# Patient Record
Sex: Female | Born: 2000 | Race: Asian | Hispanic: No | Marital: Single | Smoking: Never smoker
Health system: Southern US, Community
[De-identification: ages and names within clinical notes are randomized; demographics above are authoritative.]

---

## 2021-04-28 ENCOUNTER — Other Ambulatory Visit: Payer: Self-pay

## 2021-04-28 ENCOUNTER — Emergency Department (HOSPITAL_COMMUNITY): Payer: Medicaid Other

## 2021-04-28 ENCOUNTER — Emergency Department (HOSPITAL_COMMUNITY)
Admission: EM | Admit: 2021-04-28 | Discharge: 2021-04-29 | Disposition: A | Discharge status: Home / Self Care | Payer: Medicaid Other | Attending: Emergency Medicine | Admitting: Emergency Medicine

## 2021-04-28 DIAGNOSIS — R1011 Right upper quadrant pain: Secondary | ICD-10-CM

## 2021-04-28 DIAGNOSIS — R1013 Epigastric pain: Secondary | ICD-10-CM | POA: Diagnosis not present

## 2021-04-28 DIAGNOSIS — R109 Unspecified abdominal pain: Secondary | ICD-10-CM | POA: Diagnosis present

## 2021-04-28 DIAGNOSIS — K2900 Acute gastritis without bleeding: Secondary | ICD-10-CM | POA: Insufficient documentation

## 2021-04-28 LAB — CBC WITH DIFFERENTIAL/PLATELET
Abs Immature Granulocytes: 0.03 10*3/uL (ref 0.00–0.07)
Basophils Absolute: 0 10*3/uL (ref 0.0–0.1)
Basophils Relative: 0 %
Eosinophils Absolute: 0.1 10*3/uL (ref 0.0–0.5)
Eosinophils Relative: 1 %
HCT: 39.2 % (ref 36.0–46.0)
Hemoglobin: 12.9 g/dL (ref 12.0–15.0)
Immature Granulocytes: 0 %
Lymphocytes Relative: 19 %
Lymphs Abs: 1.7 10*3/uL (ref 0.7–4.0)
MCH: 29.1 pg (ref 26.0–34.0)
MCHC: 32.9 g/dL (ref 30.0–36.0)
MCV: 88.3 fL (ref 80.0–100.0)
Monocytes Absolute: 0.4 10*3/uL (ref 0.1–1.0)
Monocytes Relative: 5 %
Neutro Abs: 6.7 10*3/uL (ref 1.7–7.7)
Neutrophils Relative %: 75 %
Platelets: 266 10*3/uL (ref 150–400)
RBC: 4.44 MIL/uL (ref 3.87–5.11)
RDW: 13 % (ref 11.5–15.5)
WBC: 8.9 10*3/uL (ref 4.0–10.5)
nRBC: 0 % (ref 0.0–0.2)

## 2021-04-28 LAB — URINALYSIS, ROUTINE W REFLEX MICROSCOPIC
Bacteria, UA: NONE SEEN
Bilirubin Urine: NEGATIVE
Glucose, UA: NEGATIVE mg/dL
Ketones, ur: 20 mg/dL — AB
Nitrite: NEGATIVE
Protein, ur: NEGATIVE mg/dL
Specific Gravity, Urine: 1.026 (ref 1.005–1.030)
pH: 5 (ref 5.0–8.0)

## 2021-04-28 LAB — COMPREHENSIVE METABOLIC PANEL
ALT: 14 U/L (ref 0–44)
AST: 16 U/L (ref 15–41)
Albumin: 4.3 g/dL (ref 3.5–5.0)
Alkaline Phosphatase: 63 U/L (ref 38–126)
Anion gap: 9 (ref 5–15)
BUN: 9 mg/dL (ref 6–20)
CO2: 21 mmol/L — ABNORMAL LOW (ref 22–32)
Calcium: 9.3 mg/dL (ref 8.9–10.3)
Chloride: 107 mmol/L (ref 98–111)
Creatinine, Ser: 0.69 mg/dL (ref 0.44–1.00)
GFR, Estimated: >60 mL/min (ref >60)
Glucose, Bld: 92 mg/dL (ref 70–99)
Potassium: 3.7 mmol/L (ref 3.5–5.1)
Sodium: 137 mmol/L (ref 135–145)
Total Bilirubin: 0.9 mg/dL (ref 0.3–1.2)
Total Protein: 7.4 g/dL (ref 6.5–8.1)

## 2021-04-28 LAB — LIPASE, BLOOD: Lipase: 29 U/L (ref 11–51)

## 2021-04-28 LAB — POC URINE PREG, ED: Preg Test, Ur: NEGATIVE

## 2021-04-28 NOTE — ED Provider Notes (Signed)
Emergency Medicine Provider Triage Evaluation Note  Emily Stanley , a 21 y.o. female  was evaluated in triage.  Pt complains of epigastric pain that started earlier today. Describes it as a burning sensation, worse after meals. She endorses a few episodes of NBNB emesis. No previous abdominal operations. No fever or chills. Denies chest pain and shortness of breath. Denies alcohol use. No chronic NSAIDs.   Review of Systems  Positive: Abdominal pain, nausea, vomiting Negative: fever  Physical Exam  BP 112/84 (BP Location: Left Arm)   Pulse 86   Temp 98.4 F (36.9 C) (Oral)   Resp 18   Ht 4\' 11"  (1.499 m)   Wt 61.7 kg   SpO2 99%   BMI 27.47 kg/m  Gen:   Awake, no distress   Resp:  Normal effort  MSK:   Moves extremities without difficulty  Other:  TTP is epigastric region with guarding  Medical Decision Making  Medically screening exam initiated at 8:17 PM.  Appropriate orders placed.  Emily Stanley was informed that the remainder of the evaluation will be completed by another provider, this initial triage assessment does not replace that evaluation, and the importance of remaining in the ED until their evaluation is complete.  Abdominal labs. RUQ Venia Carbon ordered   US 04/28/21 2019    2020, MD 04/28/21 [phone]

## 2021-04-28 NOTE — ED Triage Notes (Signed)
Pt endorses epigastric burning after eating and when lying down to sleep.  Has had vomiting.

## 2021-04-28 NOTE — ED Provider Notes (Signed)
[facility] EMERGENCY DEPARTMENT Provider Note   CSN: [id number] Arrival date & time: 04/28/21  1916     History Chief Complaint  Patient presents with   Abdominal Pain    Emily Stanley is a 20 y.o. female.  Patient presents to the emergency department for evaluation of abdominal pain.  Patient reports that she has been having upper central abdominal pain and burning with nausea and vomiting.  No hematemesis or melena.  Pain does worsen with meals.      No past medical history on file.  There are no problems to display for this patient.      OB History   No obstetric history on file.     No family history on file.     Home Medications Prior to Admission medications   Medication Sig Start Date End Date Taking? Authorizing Provider  famotidine (PEPCID) 20 MG tablet Take 1 tablet (20 mg total) by mouth 2 (two) times daily. 04/29/21  Yes Buffie Herne, Canary Brim, MD  sucralfate (CARAFATE) 1 g tablet Take 1 tablet (1 g total) by mouth 4 (four) times daily -  with meals and at bedtime. 04/29/21  Yes Shi Blankenship, Canary Brim, MD    Allergies    Patient has no known allergies.  Review of Systems   Review of Systems  Gastrointestinal:  Positive for abdominal pain, nausea and vomiting.  All other systems reviewed and are negative.  Physical Exam Updated Vital Signs BP 111/65   Pulse 90   Temp 98.4 F (36.9 C) (Oral)   Resp (!) 29   Ht 4\' 11"  (1.499 m)   Wt 61.7 kg   SpO2 98%   BMI 27.47 kg/m   Physical Exam Vitals and nursing note reviewed.  Constitutional:      General: She is not in acute distress.    Appearance: Normal appearance. She is well-developed.  HENT:     Head: Normocephalic and atraumatic.     Right Ear: Hearing normal.     Left Ear: Hearing normal.     Nose: Nose normal.  Eyes:     Conjunctiva/sclera: Conjunctivae normal.     Pupils: Pupils are equal, round, and reactive to light.  Cardiovascular:     Rate and Rhythm:  Regular rhythm.     Heart sounds: S1 normal and S2 normal. No murmur heard.   No friction rub. No gallop.  Pulmonary:     Effort: Pulmonary effort is normal. No respiratory distress.     Breath sounds: Normal breath sounds.  Chest:     Chest wall: No tenderness.  Abdominal:     General: Bowel sounds are normal.     Palpations: Abdomen is soft.     Tenderness: There is abdominal tenderness in the epigastric area. There is no guarding or rebound. Negative signs include Murphy's sign and McBurney's sign.     Hernia: No hernia is present.  Musculoskeletal:        General: Normal range of motion.     Cervical back: Normal range of motion and neck supple.  Skin:    General: Skin is warm and dry.     Findings: No rash.  Neurological:     Mental Status: She is alert and oriented to person, place, and time.     GCS: GCS eye subscore is 4. GCS verbal subscore is 5. GCS motor subscore is 6.     Cranial Nerves: No cranial nerve deficit.     Sensory: No sensory  deficit.     Coordination: Coordination normal.  Psychiatric:        Speech: Speech normal.        Behavior: Behavior normal.        Thought Content: Thought content normal.    ED Results / Procedures / Treatments   Labs (all labs ordered are listed, but only abnormal results are displayed) Labs Reviewed  COMPREHENSIVE METABOLIC PANEL - Abnormal; Notable for the following components:      Result Value   CO2 21 (*)    All other components within normal limits  URINALYSIS, ROUTINE W REFLEX MICROSCOPIC - Abnormal; Notable for the following components:   APPearance HAZY (*)    Hgb urine dipstick SMALL (*)    Ketones, ur 20 (*)    Leukocytes,Ua TRACE (*)    All other components within normal limits  CBC WITH DIFFERENTIAL/PLATELET  LIPASE, BLOOD  POC URINE PREG, ED    EKG EKG Interpretation  Date/Time:  Thursday April 28 2021 20:30:54 EDT Ventricular Rate:  81 PR Interval:  148 QRS Duration: 74 QT Interval:  364 QTC  Calculation: 422 R Axis:   78 Text Interpretation: Normal sinus rhythm Normal ECG Confirmed by Gilda Crease [phone]) on 04/28/2021 11:35:05 PM  Radiology US Abdomen Limited RUQ (LIVER/GB)  Result Date: 04/28/2021 CLINICAL DATA:  20 year old female with right upper quadrant abdominal pain. EXAM: ULTRASOUND ABDOMEN LIMITED RIGHT UPPER QUADRANT COMPARISON:  None. FINDINGS: Gallbladder: No gallstones or wall thickening visualized. No sonographic Murphy sign noted by sonographer. Common bile duct: Diameter: 3 mm Liver: No focal lesion identified. Within normal limits in parenchymal echogenicity. Portal vein is patent on color Doppler imaging with normal direction of blood flow towards the liver. Other: None. IMPRESSION: Unremarkable right upper quadrant ultrasound. Electronically Signed   By: Elgie Collard M.D.   On: 04/28/2021 21:08    Procedures Procedures   Medications Ordered in ED Medications  sodium chloride 0.9 % bolus 1,000 mL (0 mLs Intravenous Stopped 04/29/21 0259)  famotidine (PEPCID) IVPB 20 mg premix (0 mg Intravenous Stopped 04/29/21 0203)  ondansetron (ZOFRAN) injection 4 mg (4 mg Intravenous Given 04/29/21 0119)  alum & mag hydroxide-simeth (MAALOX/MYLANTA) 200-200-20 MG/5ML suspension 30 mL (30 mLs Oral Given 04/29/21 0121)    And  lidocaine (XYLOCAINE) 2 % viscous mouth solution 15 mL (15 mLs Oral Given 04/29/21 0121)    ED Course  I have reviewed the triage vital signs and the nursing notes.  Pertinent labs & imaging results that were available during my care of the patient were reviewed by me and considered in my medical decision making (see chart for details).    MDM Rules/Calculators/A&P                          Patient presents to the emergency department for evaluation of abdominal pain.  Patient experiencing upper abdominal pain with nausea and vomiting.  Pain is in the epigastric region.  She does not have any specific right upper quadrant tenderness on  exam.  Lab work reassuring.  Gallbladder ultrasound negative.  Clinical presentation consistent with gastritis.  Final Clinical Impression(s) / ED Diagnoses Final diagnoses:  RUQ pain  Acute gastritis without hemorrhage, unspecified gastritis type    Rx / DC Orders ED Discharge Orders          Ordered    famotidine (PEPCID) 20 MG tablet  2 times daily        04/29/21 5681  sucralfate (CARAFATE) 1 g tablet  3 times daily with meals & bedtime        04/29/21 0338             Gilda Crease, MD 04/29/21 [phone]

## 2021-04-29 MED ORDER — FAMOTIDINE IN NACL 20-0.9 MG/50ML-% IV SOLN
20.0000 mg | Freq: Once | INTRAVENOUS | Status: AC
  Administered 2021-04-29: 20 mg via INTRAVENOUS
  Filled 2021-04-29: qty 50

## 2021-04-29 MED ORDER — FAMOTIDINE 20 MG PO TABS
20.0000 mg | ORAL_TABLET | Freq: Two times a day (BID) | ORAL | 0 refills | Status: DC
Start: 2021-04-29 — End: 2021-07-19

## 2021-04-29 MED ORDER — LIDOCAINE VISCOUS HCL 2 % MT SOLN
15.0000 mL | Freq: Once | OROMUCOSAL | Status: AC
  Administered 2021-04-29: 15 mL via ORAL
  Filled 2021-04-29: qty 15

## 2021-04-29 MED ORDER — SUCRALFATE 1 G PO TABS: 1.0000 g | ORAL_TABLET | Freq: Three times a day (TID) | ORAL | 0 refills | Status: DC

## 2021-04-29 MED ORDER — ALUM & MAG HYDROXIDE-SIMETH 200-200-20 MG/5ML PO SUSP
30.0000 mL | Freq: Once | ORAL | Status: AC
  Administered 2021-04-29: 30 mL via ORAL
  Filled 2021-04-29: qty 30

## 2021-04-29 MED ORDER — ONDANSETRON HCL 4 MG/2ML IJ SOLN
4.0000 mg | Freq: Once | INTRAMUSCULAR | Status: AC
  Administered 2021-04-29: 4 mg via INTRAVENOUS
  Filled 2021-04-29: qty 2

## 2021-04-29 MED ORDER — SODIUM CHLORIDE 0.9 % IV BOLUS
1000.0000 mL | Freq: Once | INTRAVENOUS | Status: AC
  Administered 2021-04-29: 1000 mL via INTRAVENOUS

## 2021-06-16 DIAGNOSIS — Z419 Encounter for procedure for purposes other than remedying health state, unspecified: Secondary | ICD-10-CM | POA: Diagnosis not present

## 2021-07-16 DIAGNOSIS — Z419 Encounter for procedure for purposes other than remedying health state, unspecified: Secondary | ICD-10-CM | POA: Diagnosis not present

## 2021-07-19 ENCOUNTER — Encounter (HOSPITAL_BASED_OUTPATIENT_CLINIC_OR_DEPARTMENT_OTHER): Payer: Self-pay

## 2021-07-19 ENCOUNTER — Other Ambulatory Visit: Payer: Self-pay

## 2021-07-19 ENCOUNTER — Emergency Department (HOSPITAL_BASED_OUTPATIENT_CLINIC_OR_DEPARTMENT_OTHER)
Admission: EM | Admit: 2021-07-19 | Discharge: 2021-07-19 | Disposition: A | Discharge status: Home / Self Care | Payer: Medicaid Other | Attending: Emergency Medicine | Admitting: Emergency Medicine

## 2021-07-19 DIAGNOSIS — R1013 Epigastric pain: Secondary | ICD-10-CM | POA: Diagnosis not present

## 2021-07-19 DIAGNOSIS — R112 Nausea with vomiting, unspecified: Secondary | ICD-10-CM | POA: Diagnosis not present

## 2021-07-19 DIAGNOSIS — R1111 Vomiting without nausea: Secondary | ICD-10-CM | POA: Diagnosis not present

## 2021-07-19 DIAGNOSIS — R109 Unspecified abdominal pain: Secondary | ICD-10-CM | POA: Diagnosis not present

## 2021-07-19 DIAGNOSIS — Z743 Need for continuous supervision: Secondary | ICD-10-CM | POA: Diagnosis not present

## 2021-07-19 DIAGNOSIS — R110 Nausea: Secondary | ICD-10-CM | POA: Diagnosis not present

## 2021-07-19 LAB — COMPREHENSIVE METABOLIC PANEL
ALT: 11 U/L (ref 0–44)
AST: 13 U/L — ABNORMAL LOW (ref 15–41)
Albumin: 4.6 g/dL (ref 3.5–5.0)
Alkaline Phosphatase: 68 U/L (ref 38–126)
Anion gap: 13 (ref 5–15)
BUN: 12 mg/dL (ref 6–20)
CO2: 21 mmol/L — ABNORMAL LOW (ref 22–32)
Calcium: 9.6 mg/dL (ref 8.9–10.3)
Chloride: 107 mmol/L (ref 98–111)
Creatinine, Ser: 0.57 mg/dL (ref 0.44–1.00)
GFR, Estimated: >60 mL/min (ref >60)
Glucose, Bld: 100 mg/dL — ABNORMAL HIGH (ref 70–99)
Potassium: 3.8 mmol/L (ref 3.5–5.1)
Sodium: 141 mmol/L (ref 135–145)
Total Bilirubin: 0.5 mg/dL (ref 0.3–1.2)
Total Protein: 7.4 g/dL (ref 6.5–8.1)

## 2021-07-19 LAB — CBC WITH DIFFERENTIAL/PLATELET
Abs Immature Granulocytes: 0.01 10*3/uL (ref 0.00–0.07)
Basophils Absolute: 0 10*3/uL (ref 0.0–0.1)
Basophils Relative: 0 %
Eosinophils Absolute: 0.1 10*3/uL (ref 0.0–0.5)
Eosinophils Relative: 1 %
HCT: 39.2 % (ref 36.0–46.0)
Hemoglobin: 13.4 g/dL (ref 12.0–15.0)
Immature Granulocytes: 0 %
Lymphocytes Relative: 28 %
Lymphs Abs: 1.7 10*3/uL (ref 0.7–4.0)
MCH: 29.4 pg (ref 26.0–34.0)
MCHC: 34.2 g/dL (ref 30.0–36.0)
MCV: 86 fL (ref 80.0–100.0)
Monocytes Absolute: 0.3 10*3/uL (ref 0.1–1.0)
Monocytes Relative: 5 %
Neutro Abs: 4.1 10*3/uL (ref 1.7–7.7)
Neutrophils Relative %: 66 %
Platelets: 276 10*3/uL (ref 150–400)
RBC: 4.56 MIL/uL (ref 3.87–5.11)
RDW: 13 % (ref 11.5–15.5)
WBC: 6.2 10*3/uL (ref 4.0–10.5)
nRBC: 0 % (ref 0.0–0.2)

## 2021-07-19 LAB — HCG, SERUM, QUALITATIVE: Preg, Serum: NEGATIVE

## 2021-07-19 LAB — LIPASE, BLOOD: Lipase: 23 U/L (ref 11–51)

## 2021-07-19 MED ORDER — ONDANSETRON HCL 4 MG/2ML IJ SOLN
4.0000 mg | Freq: Once | INTRAMUSCULAR | Status: AC
  Administered 2021-07-19: 4 mg via INTRAVENOUS
  Filled 2021-07-19: qty 2

## 2021-07-19 MED ORDER — FAMOTIDINE 20 MG PO TABS: 20.0000 mg | ORAL_TABLET | Freq: Two times a day (BID) | ORAL | 0 refills | Status: DC

## 2021-07-19 MED ORDER — ALUM & MAG HYDROXIDE-SIMETH 200-200-20 MG/5ML PO SUSP
30.0000 mL | Freq: Once | ORAL | Status: AC
  Administered 2021-07-19: 30 mL via ORAL
  Filled 2021-07-19: qty 30

## 2021-07-19 MED ORDER — SODIUM CHLORIDE 0.9 % IV BOLUS
1000.0000 mL | Freq: Once | INTRAVENOUS | Status: AC
  Administered 2021-07-19: 1000 mL via INTRAVENOUS

## 2021-07-19 MED ORDER — PANTOPRAZOLE SODIUM 40 MG PO TBEC: 40.0000 mg | DELAYED_RELEASE_TABLET | Freq: Every day | ORAL | 0 refills | Status: DC

## 2021-07-19 MED ORDER — SUCRALFATE 1 G PO TABS: 1.0000 g | ORAL_TABLET | Freq: Three times a day (TID) | ORAL | 0 refills | Status: DC

## 2021-07-19 MED ORDER — SODIUM CHLORIDE 0.9 % IV SOLN: INTRAVENOUS | Status: DC

## 2021-07-19 MED ORDER — LIDOCAINE VISCOUS HCL 2 % MT SOLN
15.0000 mL | Freq: Once | OROMUCOSAL | Status: AC
  Administered 2021-07-19: 15 mL via ORAL
  Filled 2021-07-19: qty 15

## 2021-07-19 NOTE — Discharge Instructions (Signed)
Please follow-up outpatient with gastroenterology given your symptoms. You may benefit from additional medication and workup to include endoscopy to evaluate for a stomach ulcer.

## 2021-07-19 NOTE — ED Triage Notes (Signed)
Pt stated vomiting started 5:00 am this morning. Vomited 6 times. Took Famotidine aprox. 5:00 am. Vomiting has not stopped. Vomiting had been going on for 1 day.

## 2021-07-19 NOTE — ED Notes (Signed)
ED Provider at bedside. 

## 2021-07-19 NOTE — ED Provider Notes (Signed)
[facility] EMERGENCY DEPT Provider Note   CSN: [id number] Arrival date & time: 07/19/21  9892     History Chief Complaint  Patient presents with   Nausea   Emesis    Emily Stanley is a 20 y.o. female.   Emesis Associated symptoms: abdominal pain   Associated symptoms: no arthralgias, no chills, no cough, no diarrhea, no fever and no sore throat    20 year old female presenting to the emergency department with roughly 1 day of epigastric abdominal pain, described as sudden onset, burning, nonradiating, mild to moderate in intensity with associated episodes of NBNB emesis.  Patient denies any fevers or chills.  She has been taking famotidine which has helped with her symptoms. She was last seen in the emergency department in July with identical symptoms and was diagnosed with gastritis.  No hematemesis or melena. Her last BM was this morning and was normal. Her last menstrual period was mid September.  She denies any vaginal bleeding or vaginal discharge.  History reviewed. No pertinent past medical history.  There are no problems to display for this patient.   History reviewed. No pertinent surgical history.   OB History     Gravida  0   Para  0   Term  0   Preterm  0   AB  0   Living  0      SAB  0   IAB  0   Ectopic  0   Multiple  0   Live Births  0           History reviewed. No pertinent family history.  Social History   Tobacco Use   Smoking status: Never    Passive exposure: Never   Smokeless tobacco: Never  Vaping Use   Vaping Use: Never used  Substance Use Topics   Alcohol use: Not Currently    Alcohol/week: 1.0 standard drink    Types: 1 Glasses of wine per week   Drug use: Never    Home Medications Prior to Admission medications   Medication Sig Start Date End Date Taking? Authorizing Provider  pantoprazole (PROTONIX) 40 MG tablet Take 1 tablet (40 mg total) by mouth daily. 07/19/21 10/17/21 Yes Ernie Avena, MD   famotidine (PEPCID) 20 MG tablet Take 1 tablet (20 mg total) by mouth 2 (two) times daily. 07/19/21 10/17/21  Ernie Avena, MD  sucralfate (CARAFATE) 1 g tablet Take 1 tablet (1 g total) by mouth 4 (four) times daily -  with meals and at bedtime. 07/19/21   Ernie Avena, MD    Allergies    Patient has no allergy information on record.  Review of Systems   Review of Systems  Constitutional:  Negative for chills and fever.  HENT:  Negative for ear pain and sore throat.   Eyes:  Negative for pain and visual disturbance.  Respiratory:  Negative for cough and shortness of breath.   Cardiovascular:  Negative for chest pain and palpitations.  Gastrointestinal:  Positive for abdominal pain, nausea and vomiting. Negative for blood in stool, constipation and diarrhea.  Genitourinary:  Negative for dysuria and hematuria.  Musculoskeletal:  Negative for arthralgias and back pain.  Skin:  Negative for color change and rash.  Neurological:  Negative for seizures and syncope.  All other systems reviewed and are negative.  Physical Exam Updated Vital Signs BP 104/65   Pulse 92   Temp 98.2 F (36.8 C) (Oral)   Resp 16   Ht 4\' 11"  (1.499  m)   Wt 63.5 kg   LMP 07/08/2021 (Exact Date)   SpO2 100%   BMI 28.28 kg/m   Physical Exam Vitals and nursing note reviewed.  Constitutional:      General: She is not in acute distress.    Appearance: She is well-developed.     Comments: Actively retching, otherwise in NAD  HENT:     Head: Normocephalic and atraumatic.  Eyes:     Conjunctiva/sclera: Conjunctivae normal.  Cardiovascular:     Rate and Rhythm: Normal rate and regular rhythm.     Heart sounds: No murmur heard. Pulmonary:     Effort: Pulmonary effort is normal. No respiratory distress.     Breath sounds: Normal breath sounds.  Abdominal:     Palpations: Abdomen is soft.     Tenderness: There is abdominal tenderness in the epigastric area and left upper quadrant. There is no guarding  or rebound.  Musculoskeletal:     Cervical back: Neck supple.  Skin:    General: Skin is warm and dry.  Neurological:     General: No focal deficit present.     Mental Status: She is alert. Mental status is at baseline.    ED Results / Procedures / Treatments   Labs (all labs ordered are listed, but only abnormal results are displayed) Labs Reviewed  COMPREHENSIVE METABOLIC PANEL - Abnormal; Notable for the following components:      Result Value   CO2 21 (*)    Glucose, Bld 100 (*)    AST 13 (*)    All other components within normal limits  LIPASE, BLOOD  CBC WITH DIFFERENTIAL/PLATELET  HCG, SERUM, QUALITATIVE    EKG None  Radiology No results found.  Procedures Procedures   Medications Ordered in ED Medications  sodium chloride 0.9 % bolus 1,000 mL (0 mLs Intravenous Stopped 07/19/21 0827)    And  0.9 %  sodium chloride infusion (has no administration in time range)  ondansetron (ZOFRAN) injection 4 mg (4 mg Intravenous Given 07/19/21 0725)  alum & mag hydroxide-simeth (MAALOX/MYLANTA) 200-200-20 MG/5ML suspension 30 mL (30 mLs Oral Given 07/19/21 0731)    And  lidocaine (XYLOCAINE) 2 % viscous mouth solution 15 mL (15 mLs Oral Given 07/19/21 0731)    ED Course  I have reviewed the triage vital signs and the nursing notes.  Pertinent labs & imaging results that were available during my care of the patient were reviewed by me and considered in my medical decision making (see chart for details).    MDM Rules/Calculators/A&P                            20 year old female presenting to the emergency department with roughly 1 day of epigastric abdominal pain, described as sudden onset, burning, nonradiating, mild to moderate in intensity with associated episodes of NBNB emesis.    On arrival, the patient was afebrile, hemodynamically stable, not tachycardic or tachypneic.  Physical exam significant for mild epigastric abdominal tenderness to palpation and left upper  quadrant tenderness.  Symptoms are most consistent with gastritis or peptic ulcer disease.  Additionally considered but felt less likely pancreatitis, DKA, gallstone disease (gallstones ruled out on ultrasound imaging during the patient's last ED visit for similar complaint), constipation.  Patient's abdomen is soft and nondistended with mild tenderness only, no rebound or guarding.  I do not think further imaging is warranted at this time.  We will evaluate further with  screening labs, administer IV fluids and antiemetics, p.o. Maalox and viscous lidocaine and reassess patient's symptoms.  Laboratory work-up reviewed to include a CMP with mildly low bicarb without an elevated anion gap, normal electrolytes, normal renal and liver function, no leukocytosis, anemia or platelet abnormality, lipase normal, serum hCG negative.  Patient has no urinary symptoms, do not feel that checking urinalysis is warranted at this time.  Following the above interventions, the patient felt symptomatically improved.  Tolerating oral intake.  Prescription for Zofran provided.  We will start the patient on Protonix in addition to continuing her home famotidine.  Ambulatory referral placed to gastroenterology for a follow-up outpatient.  Final Clinical Impression(s) / ED Diagnoses Final diagnoses:  Epigastric pain  Nausea and vomiting, unspecified vomiting type    Rx / DC Orders ED Discharge Orders          Ordered    Ambulatory referral to Gastroenterology        07/19/21 0720    pantoprazole (PROTONIX) 40 MG tablet  Daily        07/19/21 0725    famotidine (PEPCID) 20 MG tablet  2 times daily        07/19/21 0725    sucralfate (CARAFATE) 1 g tablet  3 times daily with meals & bedtime        07/19/21 0725             Ernie Avena, MD 07/19/21 [phone]

## 2021-07-20 ENCOUNTER — Telehealth: Payer: Self-pay

## 2021-07-20 NOTE — Telephone Encounter (Signed)
Transition Care Management Unsuccessful Follow-up Telephone Call  Date of discharge and from where:  07/19/2021-[facility]  Attempts:  1st Attempt  Reason for unsuccessful TCM follow-up call:  Unable to leave message

## 2021-07-21 NOTE — Telephone Encounter (Signed)
Transition Care Management Unsuccessful Follow-up Telephone Call  Date of discharge and from where:  07/19/2021 from [facility]  Attempts:  2nd Attempt  Reason for unsuccessful TCM follow-up call:  Unable to leave message

## 2021-07-22 NOTE — Telephone Encounter (Signed)
Transition Care Management Follow-up Telephone Call Date of discharge and from where: 07/19/2021 from [facility] How have you been since you were released from the hospital? Pt stated that she is feeling well.  Any questions or concerns? No  Items Reviewed: Did the pt receive and understand the discharge instructions provided? Yes  Medications obtained and verified? Yes  Other? No  Any new allergies since your discharge? No  Dietary orders reviewed? No Do you have support at home? Yes   Functional Questionnaire: (I = Independent and D = Dependent) ADLs: I  Bathing/Dressing- I  Meal Prep- I  Eating- I  Maintaining continence- I  Transferring/Ambulation- I  Managing Meds- I   Follow up appointments reviewed:  PCP Hospital f/u appt confirmed? No  Pt stated that she is not interested in establishing care with a provider at this time.  Are transportation arrangements needed? No  If their condition worsens, is the pt aware to call PCP or go to the Emergency Dept.? Yes Was the patient provided with contact information for the PCP's office or ED? Yes Was to pt encouraged to call back with questions or concerns? Yes

## 2021-08-03 DIAGNOSIS — Z7189 Other specified counseling: Secondary | ICD-10-CM | POA: Diagnosis not present

## 2021-08-03 DIAGNOSIS — Z23 Encounter for immunization: Secondary | ICD-10-CM | POA: Diagnosis not present

## 2021-08-16 DIAGNOSIS — Z419 Encounter for procedure for purposes other than remedying health state, unspecified: Secondary | ICD-10-CM | POA: Diagnosis not present

## 2021-09-12 DIAGNOSIS — Z13228 Encounter for screening for other metabolic disorders: Secondary | ICD-10-CM | POA: Diagnosis not present

## 2021-09-12 DIAGNOSIS — L709 Acne, unspecified: Secondary | ICD-10-CM | POA: Diagnosis not present

## 2021-09-15 DIAGNOSIS — Z419 Encounter for procedure for purposes other than remedying health state, unspecified: Secondary | ICD-10-CM | POA: Diagnosis not present

## 2021-09-28 ENCOUNTER — Other Ambulatory Visit: Payer: Self-pay

## 2021-09-28 ENCOUNTER — Encounter (HOSPITAL_COMMUNITY): Payer: Self-pay | Admitting: Emergency Medicine

## 2021-09-28 ENCOUNTER — Emergency Department (HOSPITAL_COMMUNITY)
Admission: EM | Admit: 2021-09-28 | Discharge: 2021-09-29 | Disposition: A | Discharge status: Home / Self Care | Payer: Medicaid Other | Attending: Emergency Medicine | Admitting: Emergency Medicine

## 2021-09-28 DIAGNOSIS — Z5321 Procedure and treatment not carried out due to patient leaving prior to being seen by health care provider: Secondary | ICD-10-CM | POA: Diagnosis not present

## 2021-09-28 DIAGNOSIS — K219 Gastro-esophageal reflux disease without esophagitis: Secondary | ICD-10-CM | POA: Diagnosis not present

## 2021-09-28 DIAGNOSIS — Z743 Need for continuous supervision: Secondary | ICD-10-CM | POA: Diagnosis not present

## 2021-09-28 DIAGNOSIS — R6889 Other general symptoms and signs: Secondary | ICD-10-CM | POA: Diagnosis not present

## 2021-09-28 NOTE — ED Triage Notes (Signed)
BIB EMS for reflux symptoms. These are the same she has all the time.  Has been seen for this.  Has not taken her prescribed medications for same.

## 2021-09-29 NOTE — ED Notes (Signed)
Pt called for vitals multiple times and no response

## 2021-09-30 ENCOUNTER — Telehealth: Payer: Self-pay

## 2021-09-30 NOTE — Telephone Encounter (Signed)
Transition Care Management Follow-up Telephone Call Date of discharge and from where: 09/29/2021-[facility] How have you been since you were released from the hospital? [facility]-Patient stated she is feeling better and does not want assistance with finding a Primary Care provider.  Any questions or concerns? No

## 2021-10-16 DIAGNOSIS — Z419 Encounter for procedure for purposes other than remedying health state, unspecified: Secondary | ICD-10-CM | POA: Diagnosis not present

## 2021-11-16 DIAGNOSIS — Z419 Encounter for procedure for purposes other than remedying health state, unspecified: Secondary | ICD-10-CM | POA: Diagnosis not present

## 2021-12-14 DIAGNOSIS — Z419 Encounter for procedure for purposes other than remedying health state, unspecified: Secondary | ICD-10-CM | POA: Diagnosis not present

## 2021-12-19 ENCOUNTER — Other Ambulatory Visit: Payer: Self-pay

## 2021-12-19 ENCOUNTER — Ambulatory Visit (HOSPITAL_COMMUNITY)
Admission: EM | Admit: 2021-12-19 | Discharge: 2021-12-19 | Disposition: A | Discharge status: Home / Self Care | Payer: Medicaid Other | Attending: Family Medicine | Admitting: Family Medicine

## 2021-12-19 ENCOUNTER — Encounter (HOSPITAL_COMMUNITY): Payer: Self-pay

## 2021-12-19 DIAGNOSIS — L259 Unspecified contact dermatitis, unspecified cause: Secondary | ICD-10-CM | POA: Diagnosis not present

## 2021-12-19 MED ORDER — TRIAMCINOLONE ACETONIDE 0.1 % EX CREA: 1.0000 "application " | TOPICAL_CREAM | Freq: Two times a day (BID) | CUTANEOUS | 0 refills | Status: AC

## 2021-12-19 NOTE — Discharge Instructions (Addendum)
I Think you are reacting to something that your hands contacted ? ?Use triamcinolone cream twice daily on the rash area till better, or up to 2 weeks. ? ? ?

## 2021-12-19 NOTE — ED Provider Notes (Signed)
?[facility] ? ? ? ?CSN: [id number] ?Arrival date & time: 12/19/21  1018 ? ? ?  ? ?History   ?Chief Complaint ?No chief complaint on file. ? ? ?HPI ?Emily Stanley is a 21 y.o. female.  ? ?HPI ?Here for rash and itching of her hands that began early yesterday morning.  It is not necessarily worsened but is not improved over that time.  No dyspnea and no swelling of her face or throat.  She is unaware of any contact with any chemicals or potential allergens.  The day before she did eat barbecue ? ?She is no longer on any daily medications.  She does not have a primary doctor ? ?History reviewed. No pertinent past medical history. ? ?There are no problems to display for this patient. ? ? ?History reviewed. No pertinent surgical history. ? ?OB History   ? ? Gravida  ?0  ? Para  ?0  ? Term  ?0  ? Preterm  ?0  ? AB  ?0  ? Living  ?0  ?  ? ? SAB  ?0  ? IAB  ?0  ? Ectopic  ?0  ? Multiple  ?0  ? Live Births  ?0  ?   ?  ?  ? ? ? ?Home Medications   ? ?Prior to Admission medications   ?Medication Sig Start Date End Date Taking? Authorizing Provider  ?triamcinolone cream (KENALOG) 0.1 % Apply 1 application. topically 2 (two) times daily. To affected area on hands till better, up to 2 weeks 12/19/21  Yes Terris Germano, Gwenlyn Perking, MD  ? ? ?Family History ?History reviewed. No pertinent family history. ? ?Social History ?Social History  ? ?Tobacco Use  ? Smoking status: Never  ?  Passive exposure: Never  ? Smokeless tobacco: Never  ?Vaping Use  ? Vaping Use: Never used  ?Substance Use Topics  ? Alcohol use: Not Currently  ?  Alcohol/week: 1.0 standard drink  ?  Types: 1 Glasses of wine per week  ? Drug use: Never  ? ? ? ?Allergies   ?Patient has no known allergies. ? ? ?Review of Systems ?Review of Systems ? ? ?Physical Exam ?Triage Vital Signs ?ED Triage Vitals [12/19/21 1102]  ?Enc Vitals Group  ?   BP 112/75  ?   Pulse Rate 95  ?   Resp 16  ?   Temp 98.1 ?F (36.7 ?C)  ?   Temp Source Oral  ?   SpO2 100 %  ?   Weight   ?    Height   ?   Head Circumference   ?   Peak Flow   ?   Pain Score   ?   Pain Loc   ?   Pain Edu?   ?   Excl. in [place]?   ? ?No data found. ? ?Updated Vital Signs ?BP 112/75 (BP Location: Left Arm)   Pulse 95   Temp 98.1 ?F (36.7 ?C) (Oral)   Resp 16   LMP 12/05/2021 (Approximate)   SpO2 100%  ? ?Visual Acuity ?Right Eye Distance:   ?Left Eye Distance:   ?Bilateral Distance:   ? ?Right Eye Near:   ?Left Eye Near:    ?Bilateral Near:    ? ?Physical Exam ?Vitals reviewed.  ?Constitutional:   ?   General: She is not in acute distress. ?   Appearance: She is not toxic-appearing.  ?HENT:  ?   Mouth/Throat:  ?   Mouth: Mucous membranes are moist.  ?  Cardiovascular:  ?   Rate and Rhythm: Normal rate and regular rhythm.  ?   Heart sounds: No murmur heard. ?Pulmonary:  ?   Effort: Pulmonary effort is normal.  ?   Breath sounds: Normal breath sounds. No wheezing.  ?Musculoskeletal:  ?   Cervical back: Neck supple.  ?Lymphadenopathy:  ?   Cervical: No cervical adenopathy.  ?Skin: ?   Coloration: Skin is not jaundiced or pale.  ?   Comments: There is a fine bumpy slightly pink rash on the palmar surface of her fingers.  Also there is a little bit on her palms.  ?Neurological:  ?   Mental Status: She is alert and oriented to person, place, and time.  ?Psychiatric:     ?   Behavior: Behavior normal.  ? ? ? ?UC Treatments / Results  ?Labs ?(all labs ordered are listed, but only abnormal results are displayed) ?Labs Reviewed - No data to display ? ?EKG ? ? ?Radiology ?No results found. ? ?Procedures ?Procedures (including critical care time) ? ?Medications Ordered in UC ?Medications - No data to display ? ?Initial Impression / Assessment and Plan / UC Course  ?I have reviewed the triage vital signs and the nursing notes. ? ?Pertinent labs & imaging results that were available during my care of the patient were reviewed by me and considered in my medical decision making (see chart for details). ? ?  ? ?We will treat for contact  dermatitis.  Request is made to help her find a primary care provider ?Final Clinical Impressions(s) / UC Diagnoses  ? ?Final diagnoses:  ?Contact dermatitis, unspecified contact dermatitis type, unspecified trigger  ? ? ? ?Discharge Instructions   ? ?  ?I Think you are reacting to something that your hands contacted ? ?Use triamcinolone cream twice daily on the rash area till better, or up to 2 weeks. ? ? ? ? ? ? ?ED Prescriptions   ? ? Medication Sig Dispense Auth. Provider  ? triamcinolone cream (KENALOG) 0.1 % Apply 1 application. topically 2 (two) times daily. To affected area on hands till better, up to 2 weeks 30 g Paco Cislo, Gwenlyn Perking, MD  ? ?  ? ?PDMP not reviewed this encounter. ?  ?Barrett Henle, MD ?12/19/21 1127 ? ?

## 2021-12-19 NOTE — ED Triage Notes (Signed)
Pt presents to the office for rash on both hands x 2-3 days.  ?

## 2022-01-14 DIAGNOSIS — Z419 Encounter for procedure for purposes other than remedying health state, unspecified: Secondary | ICD-10-CM | POA: Diagnosis not present

## 2022-02-13 DIAGNOSIS — Z419 Encounter for procedure for purposes other than remedying health state, unspecified: Secondary | ICD-10-CM | POA: Diagnosis not present

## 2022-02-21 ENCOUNTER — Other Ambulatory Visit: Payer: Self-pay

## 2022-02-21 ENCOUNTER — Emergency Department (HOSPITAL_COMMUNITY)
Admission: EM | Admit: 2022-02-21 | Discharge: 2022-02-22 | Disposition: A | Discharge status: Home / Self Care | Payer: Medicaid Other | Attending: Emergency Medicine | Admitting: Emergency Medicine

## 2022-02-21 ENCOUNTER — Encounter (HOSPITAL_COMMUNITY): Payer: Self-pay

## 2022-02-21 DIAGNOSIS — R109 Unspecified abdominal pain: Secondary | ICD-10-CM | POA: Insufficient documentation

## 2022-02-21 DIAGNOSIS — Z5321 Procedure and treatment not carried out due to patient leaving prior to being seen by health care provider: Secondary | ICD-10-CM | POA: Insufficient documentation

## 2022-02-21 LAB — URINALYSIS, ROUTINE W REFLEX MICROSCOPIC
Bilirubin Urine: NEGATIVE
Glucose, UA: NEGATIVE mg/dL
Hgb urine dipstick: NEGATIVE
Ketones, ur: NEGATIVE mg/dL
Leukocytes,Ua: NEGATIVE
Nitrite: NEGATIVE
Protein, ur: NEGATIVE mg/dL
Specific Gravity, Urine: 1.025 (ref 1.005–1.030)
pH: 6 (ref 5.0–8.0)

## 2022-02-21 LAB — COMPREHENSIVE METABOLIC PANEL WITH GFR
ALT: 34 U/L (ref 0–44)
AST: 31 U/L (ref 15–41)
Albumin: 4.1 g/dL (ref 3.5–5.0)
Alkaline Phosphatase: 72 U/L (ref 38–126)
Anion gap: 9 (ref 5–15)
BUN: 12 mg/dL (ref 6–20)
CO2: 20 mmol/L — ABNORMAL LOW (ref 22–32)
Calcium: 9.1 mg/dL (ref 8.9–10.3)
Chloride: 110 mmol/L (ref 98–111)
Creatinine, Ser: 0.69 mg/dL (ref 0.44–1.00)
GFR, Estimated: >60 mL/min
Glucose, Bld: 83 mg/dL (ref 70–99)
Potassium: 3.7 mmol/L (ref 3.5–5.1)
Sodium: 139 mmol/L (ref 135–145)
Total Bilirubin: 0.9 mg/dL (ref 0.3–1.2)
Total Protein: 7.4 g/dL (ref 6.5–8.1)

## 2022-02-21 LAB — CBC
HCT: 38.2 % (ref 36.0–46.0)
Hemoglobin: 12.8 g/dL (ref 12.0–15.0)
MCH: 29.2 pg (ref 26.0–34.0)
MCHC: 33.5 g/dL (ref 30.0–36.0)
MCV: 87 fL (ref 80.0–100.0)
Platelets: 303 K/uL (ref 150–400)
RBC: 4.39 MIL/uL (ref 3.87–5.11)
RDW: 13.1 % (ref 11.5–15.5)
WBC: 10.1 K/uL (ref 4.0–10.5)
nRBC: 0 % (ref 0.0–0.2)

## 2022-02-21 LAB — I-STAT BETA HCG BLOOD, ED (MC, WL, AP ONLY): I-stat hCG, quantitative: <5 m[IU]/mL

## 2022-02-21 LAB — LIPASE, BLOOD: Lipase: 31 U/L (ref 11–51)

## 2022-02-21 NOTE — ED Triage Notes (Incomplete)
Pt report of left sided abd pain. Pt reports she  ?

## 2022-02-21 NOTE — ED Provider Triage Note (Signed)
Emergency Medicine Provider Triage Evaluation Note ? ?Emily Stanley , a 21 y.o. female  was evaluated in triage.  Pt complains of left sided abd pain seems this happens often to her.  ? ?She states she was given some kind of medicine that helped with her sx in the past (seems the saline she was given helped the most).  ? ?No NVD no fevers or chills, states she is not pregnant.  ? ?No urinary sx.  ? ?No blood in stool ? ?Review of Systems  ?Positive: Abd pain ?Negative: NVD ? ?Physical Exam  ?BP 108/77   Pulse 74   Temp 97.8 ?F (36.6 ?C)   Resp 16   SpO2 98%  ?Gen:   Awake, no distress   ?Resp:  Normal effort  ?MSK:   Moves extremities without difficulty  ?Other:  Abd soft NTTP ? ?Medical Decision Making  ?Medically screening exam initiated at 8:27 PM.  Appropriate orders placed.  Emily Stanley was informed that the remainder of the evaluation will be completed by another provider, this initial triage assessment does not replace that evaluation, and the importance of remaining in the ED until their evaluation is complete. ? ?Exam WNLs ? ?Labs ordered ?  ?Gailen Shelter, [place] ?02/21/22 2030 ? ?

## 2022-03-16 DIAGNOSIS — Z419 Encounter for procedure for purposes other than remedying health state, unspecified: Secondary | ICD-10-CM | POA: Diagnosis not present

## 2022-04-10 ENCOUNTER — Emergency Department (HOSPITAL_COMMUNITY)
Admission: EM | Admit: 2022-04-10 | Discharge: 2022-04-10 | Disposition: A | Discharge status: Home / Self Care | Payer: Medicaid Other | Attending: Emergency Medicine | Admitting: Emergency Medicine

## 2022-04-10 ENCOUNTER — Other Ambulatory Visit: Payer: Self-pay

## 2022-04-10 ENCOUNTER — Encounter (HOSPITAL_COMMUNITY): Payer: Self-pay

## 2022-04-10 DIAGNOSIS — Z5321 Procedure and treatment not carried out due to patient leaving prior to being seen by health care provider: Secondary | ICD-10-CM | POA: Diagnosis not present

## 2022-04-10 DIAGNOSIS — J029 Acute pharyngitis, unspecified: Secondary | ICD-10-CM | POA: Diagnosis not present

## 2022-04-10 LAB — GROUP A STREP BY PCR: Group A Strep by PCR: NOT DETECTED

## 2022-04-10 NOTE — ED Triage Notes (Signed)
Pt states she has had a headache and sore throat x 2 days. Pt denies fever.

## 2022-04-10 NOTE — ED Provider Triage Note (Signed)
Emergency Medicine Provider Triage Evaluation Note  Emily Stanley , a 21 y.o. female  was evaluated in triage.  Pt complains of sore throat for 2 days.  Denies associated cough, shortness of breath, drooling.  Has not been taking any medication to help with her symptoms.  Review of Systems  Positive: Sore throat Negative: Cough, shortness of breath, drooling  Physical Exam  BP 105/77   Pulse (!) 102   Temp 98.6 F (37 C) (Oral)   Resp 16   Ht 4\' 11"  (1.499 m)   Wt 54.4 kg   LMP 02/28/2022   SpO2 99%   BMI 24.24 kg/m  Gen:   Awake, no distress   Resp:  Normal effort  MSK:   Moves extremities without difficulty  Other:  No tonsillar enlargement noted bilaterally.  No trismus or drooling noted.  Medical Decision Making  Medically screening exam initiated at 5:58 PM.  Appropriate orders placed.  Emily Stanley was informed that the remainder of the evaluation will be completed by another provider, this initial triage assessment does not replace that evaluation, and the importance of remaining in the ED until their evaluation is complete.  Order strep test   Emily Pates, PA-C 04/10/22 1758

## 2022-04-15 DIAGNOSIS — Z419 Encounter for procedure for purposes other than remedying health state, unspecified: Secondary | ICD-10-CM | POA: Diagnosis not present

## 2022-05-16 DIAGNOSIS — Z419 Encounter for procedure for purposes other than remedying health state, unspecified: Secondary | ICD-10-CM | POA: Diagnosis not present

## 2023-05-31 IMAGING — US US ABDOMEN LIMITED
1 series · 14 of 25 positions shown · non-contrast
Comparison: None.

CLINICAL DATA: 19-year-old female with right upper quadrant
abdominal pain.

EXAM:
ULTRASOUND ABDOMEN LIMITED RIGHT UPPER QUADRANT

[Series 1: us abdomen limited ruq (liver/gb) · 14 of 39 slices shown]
[im 1/39]
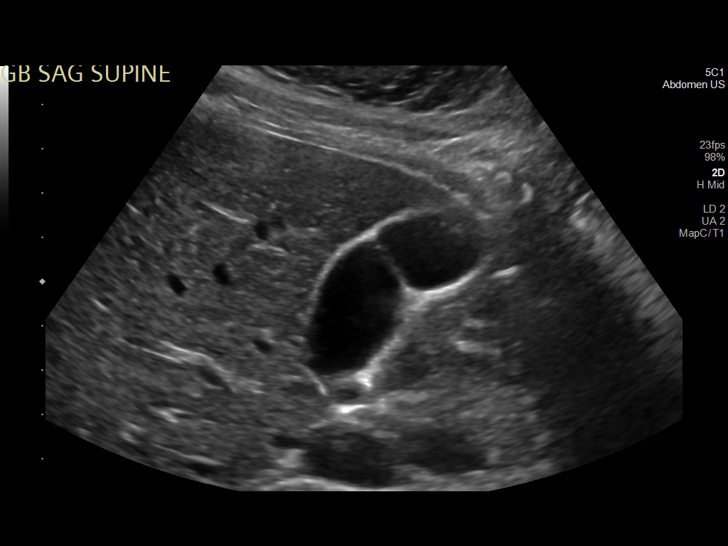
[im 4/39]
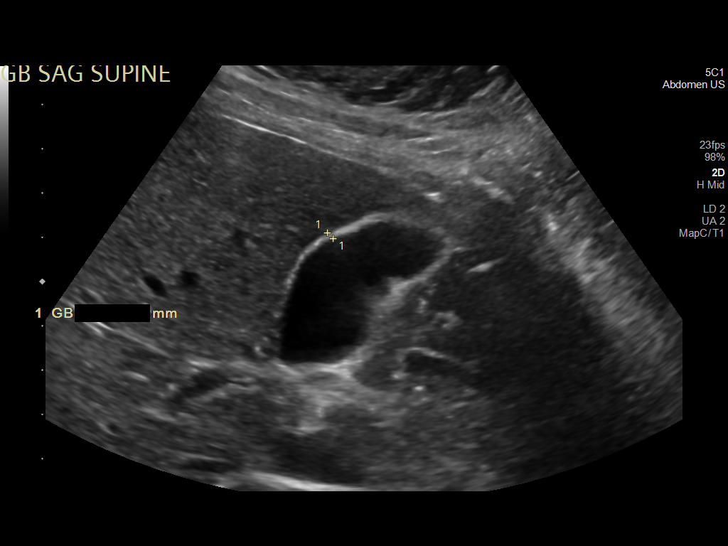
[im 7/39]
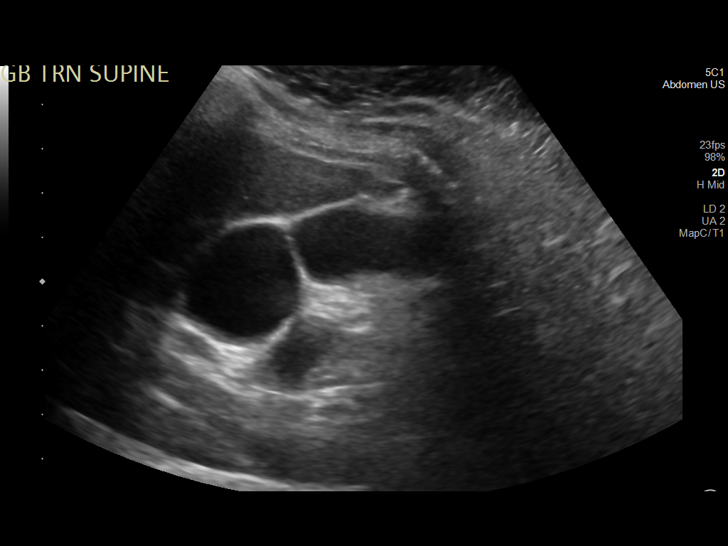
[im 10/39]
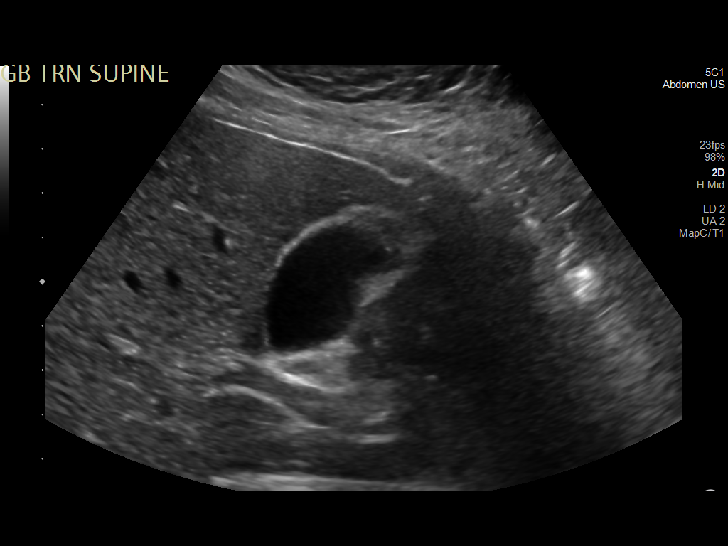
[im 13/39]
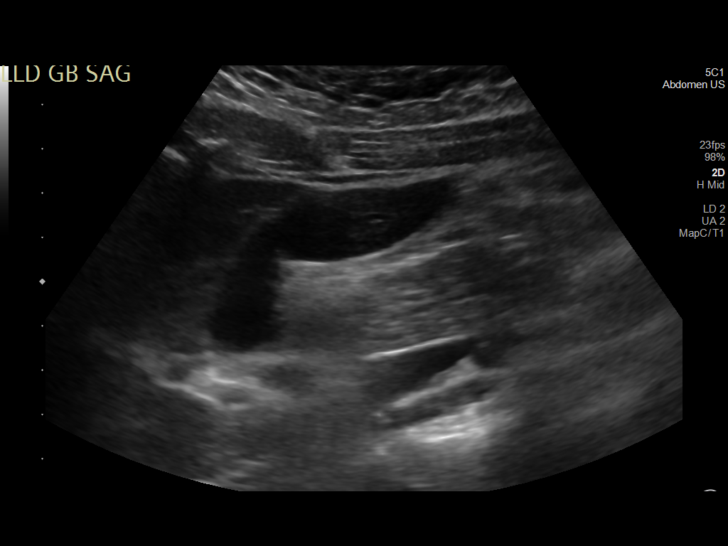
[im 15/39]
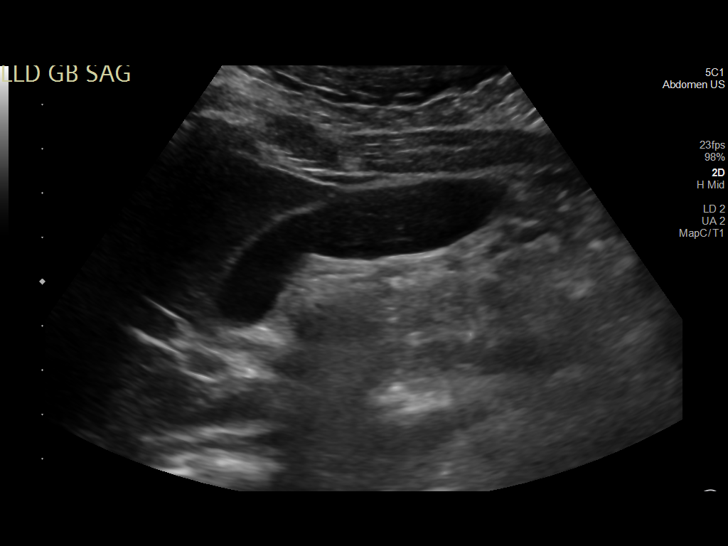
[im 18/39]
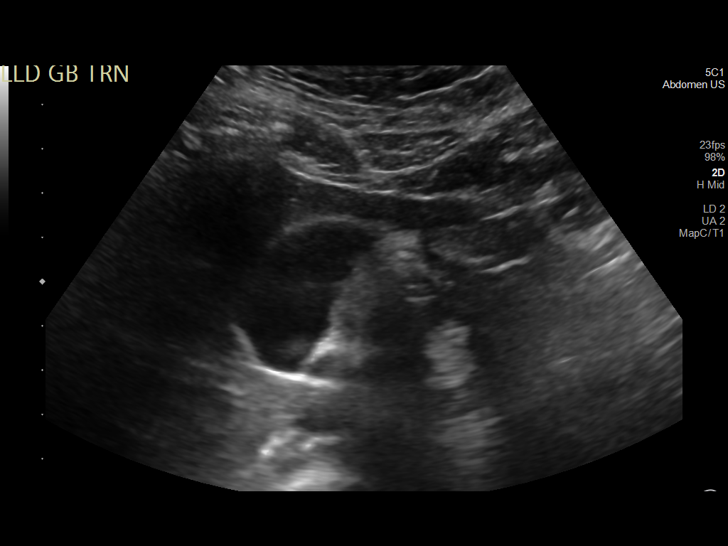
[im 21/39]
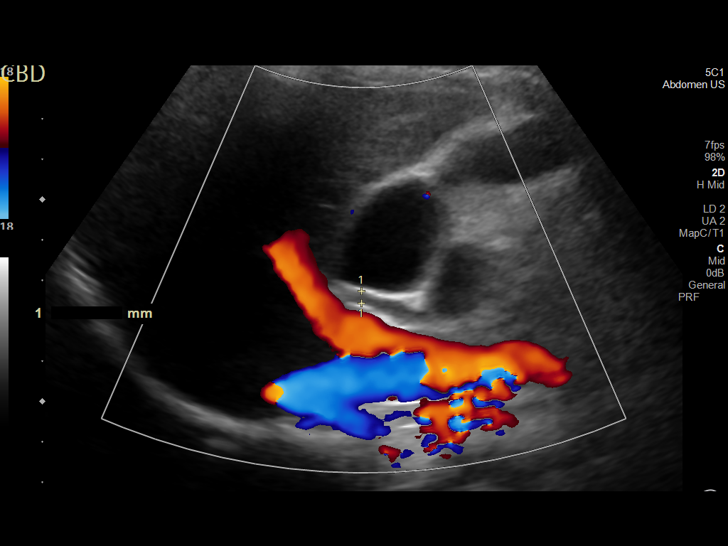
[im 24/39]
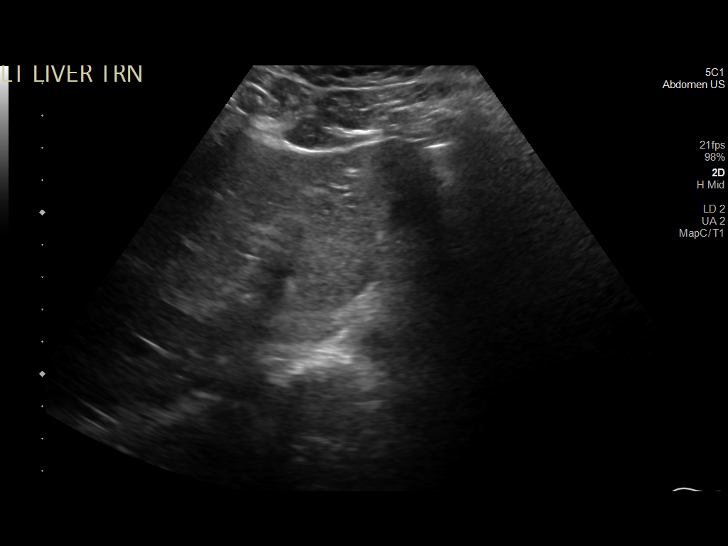
[im 26/39]
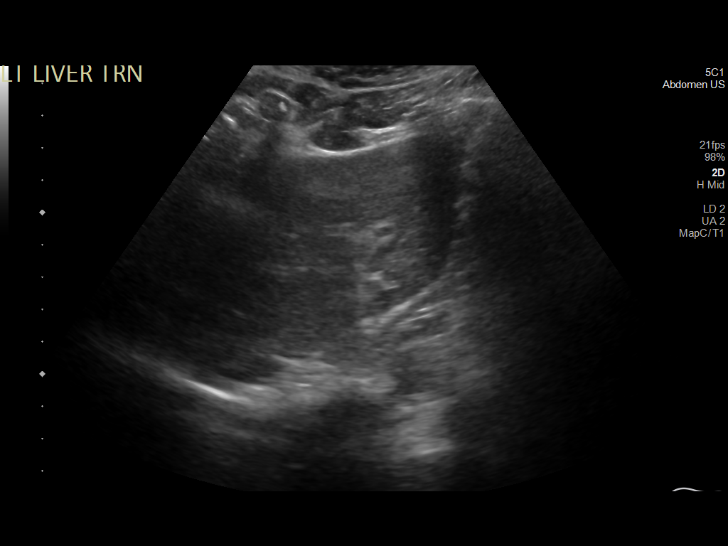
[im 29/39]
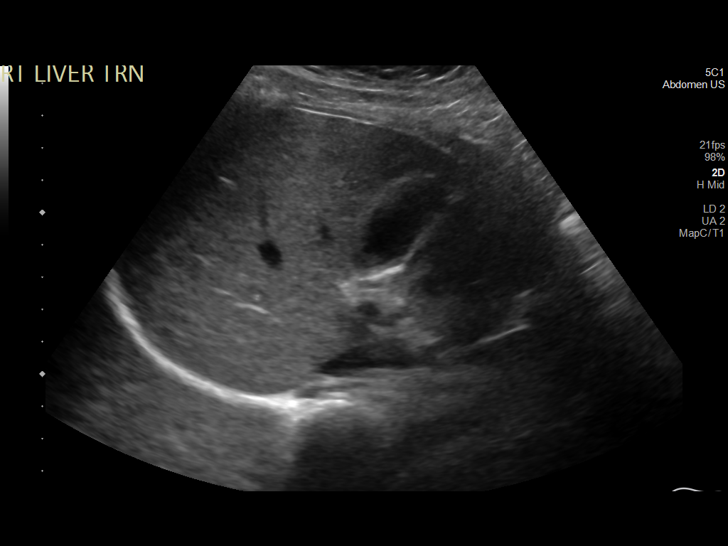
[im 32/39]
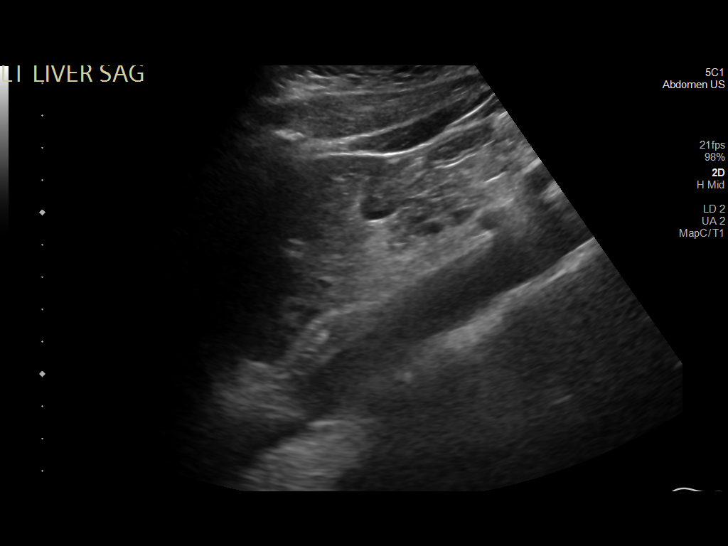
[im 35/39]
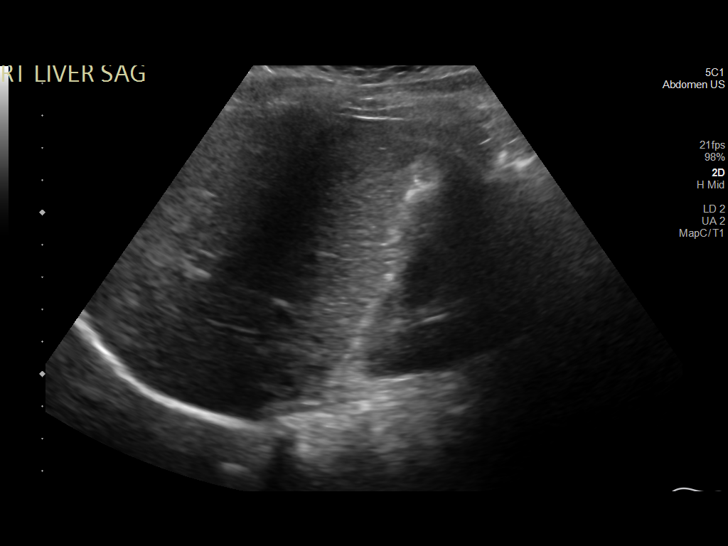
[im 39/39]
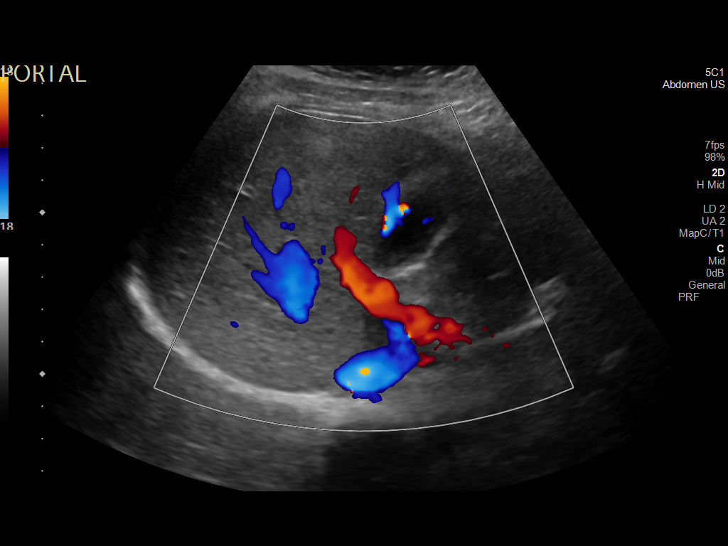

[14 of 25 positions shown; findings below may reference images not displayed]

FINDINGS: Gallbladder:

No gallstones or wall thickening visualized. No sonographic Murphy
sign noted by sonographer.

Common bile duct:

Diameter: 3 mm

Liver:

No focal lesion identified. Within normal limits in parenchymal
echogenicity. Portal vein is patent on color Doppler imaging with
normal direction of blood flow towards the liver.

Other: None.
IMPRESSION: Unremarkable right upper quadrant ultrasound.
